# Patient Record
Sex: Female | Born: 1995 | Race: White | Hispanic: No | Marital: Single | Smoking: Never smoker
Health system: Southern US, Community
[De-identification: ages and names within clinical notes are randomized; demographics above are authoritative.]

## PROBLEM LIST (undated history)

## (undated) DIAGNOSIS — L509 Urticaria, unspecified: Secondary | ICD-10-CM

## (undated) HISTORY — DX: Urticaria, unspecified: L50.9

---

## 2019-03-05 ENCOUNTER — Other Ambulatory Visit: Payer: Self-pay | Admitting: Obstetrics and Gynecology

## 2019-03-05 DIAGNOSIS — N6320 Unspecified lump in the left breast, unspecified quadrant: Secondary | ICD-10-CM

## 2019-03-14 ENCOUNTER — Other Ambulatory Visit: Payer: Self-pay

## 2019-03-14 ENCOUNTER — Ambulatory Visit
Admission: RE | Admit: 2019-03-14 | Discharge: 2019-03-14 | Disposition: A | Discharge status: Home / Self Care | Payer: Commercial Managed Care - PPO | Source: Ambulatory Visit | Attending: Obstetrics and Gynecology | Admitting: Obstetrics and Gynecology

## 2019-03-14 DIAGNOSIS — N6320 Unspecified lump in the left breast, unspecified quadrant: Secondary | ICD-10-CM

## 2020-01-06 ENCOUNTER — Other Ambulatory Visit: Payer: Self-pay

## 2020-01-06 ENCOUNTER — Encounter (HOSPITAL_COMMUNITY): Payer: Self-pay

## 2020-01-06 DIAGNOSIS — Z79899 Other long term (current) drug therapy: Secondary | ICD-10-CM | POA: Insufficient documentation

## 2020-01-06 DIAGNOSIS — R110 Nausea: Secondary | ICD-10-CM | POA: Insufficient documentation

## 2020-01-06 DIAGNOSIS — R1013 Epigastric pain: Secondary | ICD-10-CM | POA: Insufficient documentation

## 2020-01-06 LAB — COMPREHENSIVE METABOLIC PANEL
ALT: 24 U/L (ref 0–44)
AST: 20 U/L (ref 15–41)
Albumin: 4.2 g/dL (ref 3.5–5.0)
Alkaline Phosphatase: 103 U/L (ref 38–126)
Anion gap: 9 (ref 5–15)
BUN: 10 mg/dL (ref 6–20)
CO2: 26 mmol/L (ref 22–32)
Calcium: 9.1 mg/dL (ref 8.9–10.3)
Chloride: 103 mmol/L (ref 98–111)
Creatinine, Ser: 0.62 mg/dL (ref 0.44–1.00)
GFR calc Af Amer: >60 mL/min (ref >60)
GFR calc non Af Amer: >60 mL/min (ref >60)
Glucose, Bld: 122 mg/dL — ABNORMAL HIGH (ref 70–99)
Potassium: 4.3 mmol/L (ref 3.5–5.1)
Sodium: 138 mmol/L (ref 135–145)
Total Bilirubin: 0.3 mg/dL (ref 0.3–1.2)
Total Protein: 7.4 g/dL (ref 6.5–8.1)

## 2020-01-06 LAB — CBC
HCT: 43.2 % (ref 36.0–46.0)
Hemoglobin: 13.9 g/dL (ref 12.0–15.0)
MCH: 27.3 pg (ref 26.0–34.0)
MCHC: 32.2 g/dL (ref 30.0–36.0)
MCV: 84.9 fL (ref 80.0–100.0)
Platelets: 319 10*3/uL (ref 150–400)
RBC: 5.09 MIL/uL (ref 3.87–5.11)
RDW: 13.2 % (ref 11.5–15.5)
WBC: 9.7 10*3/uL (ref 4.0–10.5)
nRBC: 0 % (ref 0.0–0.2)

## 2020-01-06 LAB — I-STAT BETA HCG BLOOD, ED (MC, WL, AP ONLY): I-stat hCG, quantitative: <5 m[IU]/mL (ref <5)

## 2020-01-06 LAB — LIPASE, BLOOD: Lipase: 27 U/L (ref 11–51)

## 2020-01-06 LAB — URINALYSIS, ROUTINE W REFLEX MICROSCOPIC
Bilirubin Urine: NEGATIVE
Glucose, UA: NEGATIVE mg/dL
Hgb urine dipstick: NEGATIVE
Ketones, ur: NEGATIVE mg/dL
Leukocytes,Ua: NEGATIVE
Nitrite: NEGATIVE
Protein, ur: NEGATIVE mg/dL
Specific Gravity, Urine: 1.018 (ref 1.005–1.030)
pH: 8 (ref 5.0–8.0)

## 2020-01-06 NOTE — ED Triage Notes (Signed)
Upper mid abdominal pain x 1 week. Pain worse after eating.

## 2020-01-06 NOTE — ED Notes (Signed)
Pt provided w/labeled urine specimen cup for collection. ENMiles 

## 2020-01-07 ENCOUNTER — Emergency Department (HOSPITAL_COMMUNITY)
Admission: EM | Admit: 2020-01-07 | Discharge: 2020-01-07 | Disposition: A | Discharge status: Home / Self Care | Payer: Commercial Managed Care - PPO | Attending: Emergency Medicine | Admitting: Emergency Medicine

## 2020-01-07 DIAGNOSIS — R1013 Epigastric pain: Secondary | ICD-10-CM

## 2020-01-07 MED ORDER — OMEPRAZOLE 20 MG PO CPDR: DELAYED_RELEASE_CAPSULE | ORAL | 0 refills | Status: AC

## 2020-01-07 MED ORDER — ONDANSETRON HCL 4 MG PO TABS: 4.0000 mg | ORAL_TABLET | Freq: Three times a day (TID) | ORAL | 0 refills | Status: AC | PRN

## 2020-01-07 NOTE — ED Notes (Signed)
Pt denies any episodes of emesis. Pt states she's had 4 episdoes of diarrhea. She also states that she had one episode of blood in her stools but is unsure as to whether or not it was due to her menstrual cycle. Pt states she has some nausea that comes and goes. Pt denies chest pain, fever, SOB. Pt denies any one around her that has been sick with similar symptoms.

## 2020-01-07 NOTE — ED Notes (Signed)
Verbalized understanding discharge instructions, prescriptions, and follow-up.  Pt knows not to eat and call us first thing.   In no acute distress.      No d/c vitals.  Pt was anxious to leave.

## 2020-01-07 NOTE — Discharge Instructions (Addendum)
Please call [phone] after 8:30 this morning to schedule an outpatient ultrasound of your abdomen to look for gallstones.  For that test you need to have not eaten for at least 6 hours before hand.  I would wait to eat breakfast in the morning to see what time your appointment is.  Take the medication, omeprazole as prescribed.  If you do have gallstones you can call Dr. Maurine Minister office, she is a Development worker, international aid, to discuss treatment.  If you do not have gallstones and you continue to have abdominal discomfort, call [facility] to be evaluated by a stomach specialist.  Return to the emergency department if you get fever, uncontrolled vomiting, or you have constant abdominal pain that is lasting more than a couple hours.

## 2020-01-07 NOTE — ED Provider Notes (Signed)
[facility]-EMERGENCY DEPT Provider Note   CSN: [id number] Arrival date & time: 01/06/20  2027   Time seen 2:33 AM  History Chief Complaint  Patient presents with  . Abdominal Pain    Nichole Wagner is a 24 y.o. female.  HPI   Patient states May 15 she ate at a [employer] and ate grilled shrimp.  She states that day she had some queasiness or nausea.  The following day she started having epigastric abdominal pain.  She states the pain will come and go and last 30 to 60 seconds and would happen about every hour.  However she states yesterday she only had 2 episodes and today she only had one episode.  She describes the pain as sharp.  She denies having any reflux symptoms such as burning in her throat or in in her chest.  She states she noted that eating pizza made the pain a lot worse.  She has noted that she continues to have some pain after eating but she has tried to eat more of a bland diet.  She states the pain can get worse after eating either right away or sometimes an hour later.  She has been having nausea and vomiting off and on but has not had vomiting.  She did have some diarrhea on the 18th.  She is only had 4 days of diarrhea and states she had 2 episodes of diarrhea 1 day and then the other 3 days had one episode.  She denies any fever.  She denies any radiation of the pain into her back.  She states she is never had this before.  There is a family history of gallstones in the ladies.  PCP [name], Eber Jones, [name]   History reviewed. No pertinent past medical history.  There are no problems to display for this patient.   History reviewed. No pertinent surgical history.   OB History   No obstetric history on file.     No family history on file.  Social History   Tobacco Use  . Smoking status: Never Smoker  . Smokeless tobacco: Never Used  Substance Use Topics  . Alcohol use: Yes  . Drug use: Never  employed  Home Medications Prior  to Admission medications   Medication Sig Start Date End Date Taking? Authorizing Provider  levonorgestrel-ethinyl estradiol (KURVELO) 0.15-30 MG-MCG tablet Take 1 tablet by mouth daily.    Yes [provider]  levothyroxine (SYNTHROID) 75 MCG tablet Take 75 mcg by mouth daily before breakfast.    Yes [provider]  sertraline (ZOLOFT) 25 MG tablet Take 25 mg by mouth daily.   Yes [provider]  omeprazole (PRILOSEC) 20 MG capsule Take 1 po BID x 2 weeks then once a day 01/07/20   [name], MD  ondansetron (ZOFRAN) 4 MG tablet Take 1 tablet (4 mg total) by mouth every 8 (eight) hours as needed for nausea or vomiting. 01/07/20   [name], MD    Allergies    Aspirin, Ibuprofen, and Naproxen  Review of Systems   Review of Systems  All other systems reviewed and are negative.   Physical Exam Updated Vital Signs BP (!) 134/102 (BP Location: Right Arm)   Pulse 79   Temp 98.2 F (36.8 C) (Oral)   Resp 16   Ht 5\' 3"  (1.6 m)   Wt 99.8 kg   SpO2 99%   BMI 38.97 kg/m   Physical Exam Vitals and nursing note reviewed.  Constitutional:      Appearance: Normal appearance. She is obese.  HENT:     Head: Normocephalic and atraumatic.     Right Ear: External ear normal.     Left Ear: External ear normal.     Nose: Nose normal.     Mouth/Throat:     Pharynx: Oropharynx is clear. No oropharyngeal exudate or posterior oropharyngeal erythema.  Eyes:     Extraocular Movements: Extraocular movements intact.     Conjunctiva/sclera: Conjunctivae normal.     Pupils: Pupils are equal, round, and reactive to light.  Cardiovascular:     Rate and Rhythm: Normal rate and regular rhythm.  Pulmonary:     Effort: Pulmonary effort is normal. No respiratory distress.  Abdominal:     General: Abdomen is flat. Bowel sounds are normal.     Palpations: Abdomen is soft.     Tenderness: There is abdominal tenderness.     Comments: Mild tenderness in the epigastric area    Musculoskeletal:        General: Normal range of motion.     Cervical back: Normal range of motion.  Skin:    General: Skin is warm and dry.  Neurological:     General: No focal deficit present.     Mental Status: She is alert and oriented to person, place, and time.     Cranial Nerves: No cranial nerve deficit.  Psychiatric:        Mood and Affect: Mood normal.        Behavior: Behavior normal.        Thought Content: Thought content normal.     ED Results / Procedures / Treatments   Labs (all labs ordered are listed, but only abnormal results are displayed) Results for orders placed or performed during the hospital encounter of 01/07/20  Lipase, blood  Result Value Ref Range   Lipase 27 11 - 51 U/L  Comprehensive metabolic panel  Result Value Ref Range   Sodium 138 135 - 145 mmol/L   Potassium 4.3 3.5 - 5.1 mmol/L   Chloride 103 98 - 111 mmol/L   CO2 26 22 - 32 mmol/L   Glucose, Bld 122 (H) 70 - 99 mg/dL   BUN 10 6 - 20 mg/dL   Creatinine, Ser 8.41 0.44 - 1.00 mg/dL   Calcium 9.1 8.9 - 32.4 mg/dL   Total Protein 7.4 6.5 - 8.1 g/dL   Albumin 4.2 3.5 - 5.0 g/dL   AST 20 15 - 41 U/L   ALT 24 0 - 44 U/L   Alkaline Phosphatase 103 38 - 126 U/L   Total Bilirubin 0.3 0.3 - 1.2 mg/dL   GFR calc non Af Amer >60 >60 mL/min   GFR calc Af Amer >60 >60 mL/min   Anion gap 9 5 - 15  CBC  Result Value Ref Range   WBC 9.7 4.0 - 10.5 K/uL   RBC 5.09 3.87 - 5.11 MIL/uL   Hemoglobin 13.9 12.0 - 15.0 g/dL   HCT 40.1 02.7 - 25.3 %   MCV 84.9 80.0 - 100.0 fL   MCH 27.3 26.0 - 34.0 pg   MCHC 32.2 30.0 - 36.0 g/dL   RDW 66.4 40.3 - 47.4 %   Platelets 319 150 - 400 K/uL   nRBC 0.0 0.0 - 0.2 %  Urinalysis, Routine w reflex microscopic  Result Value Ref Range   Color, Urine YELLOW YELLOW   APPearance CLOUDY (A) CLEAR   Specific Gravity, Urine 1.018  1.005 - 1.030   pH 8.0 5.0 - 8.0   Glucose, UA NEGATIVE NEGATIVE mg/dL   Hgb urine dipstick NEGATIVE NEGATIVE   Bilirubin Urine  NEGATIVE NEGATIVE   Ketones, ur NEGATIVE NEGATIVE mg/dL   Protein, ur NEGATIVE NEGATIVE mg/dL   Nitrite NEGATIVE NEGATIVE   Leukocytes,Ua NEGATIVE NEGATIVE  I-Stat beta hCG blood, ED  Result Value Ref Range   I-stat hCG, quantitative <5.0 <5 mIU/mL   Comment 3           Laboratory interpretation all normal except nonfasting hyperglycemia    EKG None  Radiology No results found.  Procedures Procedures (including critical care time)  Medications Ordered in ED Medications - No data to display  ED Course  I have reviewed the triage vital signs and the nursing notes.  Pertinent labs & imaging results that were available during my care of the patient were reviewed by me and considered in my medical decision making (see chart for details).    MDM Rules/Calculators/A&P                      Mother is concerned that patient may have gallbladder disease.  Her labs are normal, normal LFTs, lipase, white blood cell count.  Patient has only had one episode of discomfort today.  I discharged her home to take omeprazole.  She can call to get an outpatient right upper quadrant ultrasound done to look for gallstones.  If she has gallstones she should call the surgeon on call his office to be evaluated.  If she does not and she continues to have discomfort she can call the gastroenterologist on-call to be evaluated.  I suspect she may have some mild gastritis and hopefully the omeprazole will help with her symptoms.   Final Clinical Impression(s) / ED Diagnoses Final diagnoses:  Epigastric abdominal pain    Rx / DC Orders ED Discharge Orders         Ordered    US Abdomen Limited RUQ     01/07/20 0303    omeprazole (PRILOSEC) 20 MG capsule     01/07/20 0308    ondansetron (ZOFRAN) 4 MG tablet  Every 8 hours PRN     01/07/20 0308         Plan discharge  Rolland Porter, MD, Barbette Or, MD 01/07/20 [phone]

## 2020-01-30 ENCOUNTER — Ambulatory Visit: Payer: Self-pay | Admitting: Allergy & Immunology

## 2020-03-19 ENCOUNTER — Ambulatory Visit: Payer: Self-pay | Admitting: Allergy

## 2020-04-06 NOTE — Progress Notes (Signed)
New Patient Note  RE: Nichole Wagner Tursi MRN: [id number] DOB: 1996-05-24 Date of Office Visit: 04/07/2020  Referring provider: [name], [name], [name] Primary care provider: [name], [name], [name]  Chief Complaint: Urticaria (on and off)  History of Present Illness: I had the pleasure of seeing Nichole Wagner Caesar for initial evaluation at the [facility] on 04/08/2020. She is a 24 y.o. female, who is referred here by [name], [name], [name] for the evaluation of hives.  Rash:  Rash started about 2014. No triggers noted and she had allergy testing at that time and was positive to oak trees and dust mites per patient report. At that time she noted NSAIDs triggered her hives.  Over the years, she noted that she breaks out in hives with no NSAIDS in her system and no other triggers noted.  Mainly occurs on her face, forearms. Describes them as raised, rash, itchy. Individual rashes lasts about a few hours after benadryl. No ecchymosis upon resolution. Associated symptoms include: lip swelling and throat swelling at times. No respiratory compromise.  Usually breaks out 1-2 times per month in hives. The throat swelling happens more frequently and the lip swelling occurs less frequently.   Suspected triggers are NSAIDs and unknown. Denies any fevers, chills, changes in medications, foods, personal care products or recent infections. She has tried the following therapies: benadryl and Claritin with some benefit. Tried zyrtec 10mg  TID with no benefit. Currently on Claritin 10mg  daily.  Previous work up includes: just skin testing but no bloodwork.  Denies any reflux/heartburn symptoms.   Food: Patient has been having issues with abdominal issues - abdominal cramping, diarrhea, blood in the stool.  She saw her ENT for lymph node swelling and was told that it may be allergy related.   She had US for her abdomen which was normal. She was trialed on omeprazole with no benefit.  No  correlation with eating.   No previous GI evaluation.   Red meat and certain brands of water is triggering her symptoms. However she still has issues without consuming these 2 foods.  Diarrhea is present for most days at a time and then resolves.   Dietary History: patient has been eating other foods including milk, eggs, peanut, treenuts, sesame, shellfish, seafood, soy, wheat, meats, fruits and vegetables.  Assessment and Plan: [name] is a 24 y.o. female with: Urticaria Pruritic rash started in 2014 and that time it was mainly triggered by NSAIDs. However over the years still having outbreaks with no NSAIDs in her system. Outbreak 1-2 times per month and resolves within a few hours after Benadryl. At times noted associated throat and lip swelling. No respiratory compromise. Tried Zyrtec 10 mg in the past with no benefit. Currently on Claritin 10 mg daily. Denies any reflux or heartburn symptoms. Concerned about food and environmental allergy triggers.  Today's skin testing showed: Positive to grass, trees, dust mites. Positive to almonds, chicken and cabbage. Borderline to pistachio, barley and tomatoes.   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  Recommend trying Allegra 180mg  twice a day. Stop Claritin.  Start famotidine 20mg  twice a day.  Take pictures of rash/swelling. Get bloodwork to rule out other etiologies.  Angio-edema  See assessment and plan as above.  Abdominal bloating with cramps Patient has been having issues with abdominal cramping, diarrhea and blood in the stool. She did have a GI appointment but had to cancel due to COVID-19 infection. Tried omeprazole with  no better fit. No correlation with what she is eating.  Today's skin testing showed: Positive to almonds, chicken and cabbage. Borderline to pistachio, barley and tomatoes.   Discussed with patient that I'm not sure how much of the above foods are actually causing her  symptoms.   Avoid almonds, chicken, cabbage, pistachio, barley and tomatoes - she does consume almonds and chicken quite frequently.  Keep food journal.  Follow up with GI as well.   Other allergic rhinitis Mild rhinitis symptoms in the spring and summer. Takes Claritin and Flonase with good benefit.  Today's skin testing was positive to grass, tree and dust mites.  Start environmental control measures as below.  May use over the counter antihistamines such as Allegra as above.   History of asthma History of asthma as a child but now much improved. Triggers include humidity and URIs.  Today's spirometry was unremarkable.   May use albuterol rescue inhaler 2 puffs every 4 to 6 hours as needed for shortness of breath, chest tightness, coughing, and wheezing. May use albuterol rescue inhaler 2 puffs 5 to 15 minutes prior to strenuous physical activities. Monitor frequency of use.    Return in about 2 months (around 06/07/2020).  Meds ordered this encounter  Medications  . famotidine (PEPCID) 20 MG tablet    Sig: Take 1 tablet (20 mg total) by mouth 2 (two) times daily.    Dispense:  60 tablet    Refill:  5    Lab Orders     Alpha-Gal Panel     ANA w/Reflex     CBC with Differential/Platelet     C1 esterase inhibitor, functional     C1 Esterase Inhibitor     C3 and C4     Chronic Urticaria     C-reactive protein     Sedimentation rate     Thyroid Cascade Profile     Tryptase     Comprehensive metabolic panel  Other allergy screening: Asthma: yes  Patient had asthma as a child but which improved. Now sometimes have shortness of breath especially with humidity.  URIs trigger her symptoms.  Patient has albuterol at home to use if needed.  Rhino conjunctivitis: yes  Mild rhinitis symptoms in the spring and summer. Takes Claritin with good benefit.  Takes Flonase as needed.  Medication allergy: yes Hymenoptera allergy: no Eczema: yes as a child History of  recurrent infections suggestive of immunodeficency: no  Diagnostics: Spirometry:  Tracings reviewed. Her effort: It was hard to get consistent efforts and there is a question as to whether this reflects a maximal maneuver. FVC: 3.72L FEV1: 3.37L, 102% predicted FEV1/FVC ratio: 91% Interpretation: No overt abnormalities noted given today's efforts.  Please see scanned spirometry results for details.  Skin Testing: Environmental allergy panel and select foods. Positive to grass, trees, dust mites.  Positive to almonds, chicken and cabbage. Borderline to pistachio, barley and tomatoes.  Results discussed with patient/family.  Airborne Adult Perc - 04/07/20 0952    Time Antigen Placed 9892    Allergen Manufacturer Waynette Buttery    Location Back    Number of Test 59    Panel 1 Select    1. Control-Buffer 50% Glycerol Negative    2. Control-Histamine 1 mg/ml 2+    3. Albumin saline Negative    4. [place] --   +/-   5. [place] 2+    6. Johnson --   +\-   7. [facility] --   +\-  8. Meadow Fescue Negative    9. Perennial Rye Negative    10. Sweet Vernal Negative    11. Timothy Negative    12. Cocklebur Negative    13. Burweed Marshelder Negative    14. Ragweed, short Negative    15. Ragweed, Giant Negative    16. Plantain,  English Negative    17. Lamb's Quarters Negative    18. Sheep Sorrell Negative    19. Rough Pigweed Negative    20. Marsh Elder, Rough Negative    21. Mugwort, Common Negative    22. Ash mix Negative    23. Birch mix Negative    24. Beech American 2+    25. Box, Elder Negative    26. Cedar, red Negative    27. Cottonwood, [place] Negative    28. Elm mix Negative    29. Hickory 2+    30. Maple mix --   +/-   31. Oak, [place] mix Negative    32. Pecan Pollen --   +\-   33. Pine mix Negative    34. Sycamore Eastern Negative    35. Walnut, Black Pollen Negative    36. Alternaria alternata Negative    37. Cladosporium Herbarum Negative    38. Aspergillus mix  Negative    39. Penicillium mix Negative    40. Bipolaris sorokiniana (Helminthosporium) Negative    41. Drechslera spicifera (Curvularia) Negative    42. Mucor plumbeus Negative    43. Fusarium moniliforme Negative    44. Aureobasidium pullulans (pullulara) Negative    45. Rhizopus oryzae Negative    46. Botrytis cinera Negative    47. Epicoccum nigrum Negative    48. Phoma betae Negative    49. Candida Albicans Negative    50. Trichophyton mentagrophytes Negative    51. Mite, D Farinae  5,000 AU/ml Negative    52. Mite, D Pteronyssinus  5,000 AU/ml Negative    53. Cat Hair 10,000 BAU/ml Negative    54.  Dog Epithelia Negative    55. Mixed Feathers Negative    56. Horse Epithelia Negative    57. Cockroach, German Negative    58. Mouse Negative    59. Tobacco Leaf Negative          Intradermal - 04/07/20 1041    Time Antigen Placed 1030    Allergen Manufacturer Greer    Location Arm    Number of Test 11    Intradermal Select    Control Negative    Ragweed mix Negative    Weed mix Negative    Mold 1 Negative    Mold 2 Negative    Mold 3 Negative    Mold 4 Negative    Cat Negative    Dog Negative    Cockroach Negative    Mite mix 2+          Food Adult Perc - 04/07/20 0900    Time Antigen Placed 4098    Allergen Manufacturer Waynette Buttery    Location Back    Number of allergen test 72    1. Peanut Negative    2. Soybean Negative    3. Wheat Negative    4. Sesame Negative    5. Milk, cow Negative    6. Egg White, Chicken Negative    7. Casein Negative    8. Shellfish Mix Negative    9. Fish Mix Negative    10. Cashew Negative    11. Pecan Food Negative  12. Walnut Food Negative    13. Almond Negative   4x3   14. Hazelnut Negative    15. [place] nut Negative    16. Coconut Negative    17. Pistachio --   +\-   18. Catfish Negative    19. Bass Negative    20. Trout Negative    21. Tuna Negative    22. Salmon Negative    23. Flounder Negative    24. Codfish  Negative    25. Shrimp Negative    26. Crab Negative    27. Lobster Negative    28. Oyster Negative    29. Scallops Negative    30. Barley Negative    31. Oat  Negative   +\-   32. Rye  Negative    33. Hops Negative    34. Rice Negative    35. Cottonseed Negative    36. Saccharomyces Cerevisiae  Negative    37. Pork Negative    38. [place] Meat Negative    39. Chicken Meat --   5x5   40. Beef Negative    41. Lamb Negative    42. Tomato --   +\-   43. White Potato Negative    44. Sweet Potato Negative    45. Pea, Green/English Negative    46. Navy Bean Negative    47. Mushrooms Negative    48. Avocado Negative    49. Onion Negative    50. Cabbage --   5x3   51. Carrots Negative    52. Celery Negative    53. Corn Negative    54. Cucumber Negative    55. Grape (White seedless) Negative    56. Orange  Negative    57. Banana Negative    58. Apple Negative    59. Peach Negative    60. Strawberry Negative    61. Cantaloupe Negative    62. Watermelon Negative    63. Pineapple Negative    64. Chocolate/Cacao bean Negative    65. Karaya Gum Negative    66. Acacia (Arabic Gum) Negative    67. Cinnamon Negative    68. Nutmeg Negative    69. Ginger Negative    70. Garlic Negative    71. Pepper, black Negative    72. Mustard Negative           Past Medical History: Patient Active Problem List   Diagnosis Date Noted  . Urticaria 04/07/2020  . History of asthma 04/07/2020  . Angio-edema 04/07/2020  . Other allergic rhinitis 04/07/2020  . Abdominal bloating with cramps 04/07/2020   Past Medical History:  Diagnosis Date  . Urticaria    Past Surgical History: History reviewed. No pertinent surgical history. Medication List:  Current Outpatient Medications  Medication Sig Dispense Refill  . acetaminophen (TYLENOL) 500 MG tablet Take 1,000 mg by mouth daily as needed.    . diphenhydrAMINE (BENADRYL) 25 MG tablet Take 25 mg by mouth daily as needed.    Marland Kitchen  levonorgestrel-ethinyl estradiol (KURVELO) 0.15-30 MG-MCG tablet Take 1 tablet by mouth daily.     Marland Kitchen levothyroxine (SYNTHROID) 75 MCG tablet Take 75 mcg by mouth daily before breakfast.     . loratadine (CLARITIN) 10 MG tablet Take 10 mg by mouth daily as needed for allergies.    . Melatonin 5 MG CAPS Take 1 capsule by mouth daily as needed.    . sertraline (ZOLOFT) 25 MG tablet Take 25 mg by mouth daily.    Marland Kitchen  famotidine (PEPCID) 20 MG tablet Take 1 tablet (20 mg total) by mouth 2 (two) times daily. 60 tablet 5  . omeprazole (PRILOSEC) 20 MG capsule Take 1 po BID x 2 weeks then once a day 60 capsule 0  . ondansetron (ZOFRAN) 4 MG tablet Take 1 tablet (4 mg total) by mouth every 8 (eight) hours as needed for nausea or vomiting. 10 tablet 0   No current facility-administered medications for this visit.   Allergies: Allergies  Allergen Reactions  . Aspirin Hives  . Ibuprofen Hives  . Naproxen Hives   Social History: Social History   Socioeconomic History  . Marital status: Single    Spouse name: Not on file  . Number of children: Not on file  . Years of education: Not on file  . Highest education level: Not on file  Occupational History  . Not on file  Tobacco Use  . Smoking status: Never Smoker  . Smokeless tobacco: Never Used  Vaping Use  . Vaping Use: Never used  Substance and Sexual Activity  . Alcohol use: Not Currently  . Drug use: Never  . Sexual activity: Not on file  Other Topics Concern  . Not on file  Social History Narrative  . Not on file   Social Determinants of Health   Financial Resource Strain:   . Difficulty of Paying Living Expenses: Not on file  Food Insecurity:   . Worried About Programme researcher, broadcasting/film/video in the Last Year: Not on file  . Ran Out of Food in the Last Year: Not on file  Transportation Needs:   . Lack of Transportation (Medical): Not on file  . Lack of Transportation (Non-Medical): Not on file  Physical Activity:   . Days of Exercise per  Week: Not on file  . Minutes of Exercise per Session: Not on file  Stress:   . Feeling of Stress : Not on file  Social Connections:   . Frequency of Communication with Friends and Family: Not on file  . Frequency of Social Gatherings with Friends and Family: Not on file  . Attends Religious Services: Not on file  . Active Member of Clubs or Organizations: Not on file  . Attends Banker Meetings: Not on file  . Marital Status: Not on file   Lives in an apartment. Smoking: denies Occupation: Publishing rights manager History: Water Damage/mildew in the house: no Carpet in the family room: yes Carpet in the bedroom: yes Heating: electric Cooling: central Pet: yes 1 dog x 3 yrs, 1 cat x 4 yrs  Family History: History reviewed. No pertinent family history. Problem                               Relation Asthma                                   MGF Eczema                                No  Food allergy                          No  Allergic rhino conjunctivitis     Mother   Review of Systems  Constitutional: Negative for  appetite change, chills, fever and unexpected weight change.  HENT: Negative for congestion and rhinorrhea.   Eyes: Negative for itching.  Respiratory: Positive for shortness of breath. Negative for cough, chest tightness and wheezing.   Cardiovascular: Negative for chest pain.  Gastrointestinal: Positive for abdominal pain, blood in stool and diarrhea.  Genitourinary: Negative for difficulty urinating.  Skin: Positive for rash.  Allergic/Immunologic: Positive for environmental allergies.  Neurological: Negative for headaches.   Objective: BP 128/88   Pulse (!) 105   Temp 97.7 F (36.5 C) (Temporal)   Resp 16   Ht  (1.626 m)   Wt 237 lb 12.8 oz (107.9 kg)   SpO2 99%   BMI 40.82 kg/m  Body mass index is 40.82 kg/m. Physical Exam Vitals and nursing note reviewed.  Constitutional:      Appearance: Normal  appearance. She is well-developed. She is obese.  HENT:     Head: Normocephalic and atraumatic.     Right Ear: Tympanic membrane and external ear normal.     Left Ear: Tympanic membrane and external ear normal.     Nose: Nose normal.     Mouth/Throat:     Mouth: Mucous membranes are moist.     Pharynx: Oropharynx is clear.  Eyes:     Conjunctiva/sclera: Conjunctivae normal.  Cardiovascular:     Rate and Rhythm: Normal rate and regular rhythm.     Heart sounds: Normal heart sounds. No murmur heard.  No friction rub. No gallop.   Pulmonary:     Effort: Pulmonary effort is normal.     Breath sounds: Normal breath sounds. No wheezing, rhonchi or rales.  Musculoskeletal:     Cervical back: Neck supple.  Skin:    General: Skin is warm.     Findings: No rash.  Neurological:     Mental Status: She is alert and oriented to person, place, and time.  Psychiatric:        Behavior: Behavior normal.    The plan was reviewed with the patient/family, and all questions/concerned were addressed.  It was my pleasure to see Aasiyah today and participate in her care. Please feel free to contact me with any questions or concerns.  Sincerely,  Wyline Mood, DO Allergy & Immunology  [facility] office: [phone] [facility] office: [phone] [place] office: [phone]

## 2020-04-07 ENCOUNTER — Encounter: Payer: Self-pay | Admitting: Allergy

## 2020-04-07 ENCOUNTER — Other Ambulatory Visit: Payer: Self-pay

## 2020-04-07 ENCOUNTER — Ambulatory Visit (INDEPENDENT_AMBULATORY_CARE_PROVIDER_SITE_OTHER): Payer: Commercial Managed Care - PPO | Admitting: Allergy

## 2020-04-07 VITALS — BP 128/88 | HR 105 | Temp 97.7°F | Resp 16 | Ht 64.0 in | Wt 237.8 lb

## 2020-04-07 DIAGNOSIS — J3089 Other allergic rhinitis: Secondary | ICD-10-CM

## 2020-04-07 DIAGNOSIS — R109 Unspecified abdominal pain: Secondary | ICD-10-CM

## 2020-04-07 DIAGNOSIS — Z8709 Personal history of other diseases of the respiratory system: Secondary | ICD-10-CM | POA: Diagnosis not present

## 2020-04-07 DIAGNOSIS — J302 Other seasonal allergic rhinitis: Secondary | ICD-10-CM | POA: Insufficient documentation

## 2020-04-07 DIAGNOSIS — T783XXA Angioneurotic edema, initial encounter: Secondary | ICD-10-CM | POA: Insufficient documentation

## 2020-04-07 DIAGNOSIS — L509 Urticaria, unspecified: Secondary | ICD-10-CM | POA: Diagnosis not present

## 2020-04-07 DIAGNOSIS — T783XXD Angioneurotic edema, subsequent encounter: Secondary | ICD-10-CM

## 2020-04-07 DIAGNOSIS — R140 Abdominal distension (gaseous): Secondary | ICD-10-CM | POA: Insufficient documentation

## 2020-04-07 MED ORDER — FAMOTIDINE 20 MG PO TABS: 20.0000 mg | ORAL_TABLET | Freq: Two times a day (BID) | ORAL | 5 refills | Status: DC

## 2020-04-07 NOTE — Patient Instructions (Addendum)
Today's skin testing showed: Positive to grass, trees, dust mites.  Positive to almonds, chicken and cabbage. Borderline to pistachio, barley and tomatoes.   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  Recommend trying Allegra 180mg  twice a day. Stop Claritin.  Start famotidine 20mg  twice a day.  Take pictures of rash/swelling. Get bloodwork:  We are ordering labs, so please allow 1-2 weeks for the results to come back. With the newly implemented Cures Act, the labs might be visible to you at the same time that they become visible to me. However, I will not address the results until all of the results are back, so please be patient.  In the meantime, continue recommendations in your patient instructions, including avoidance measures (if applicable), until you hear from me.  Environmental allergies  Start environmental control measures as below.  May use over the counter antihistamines such as Allegra as above.   Abdominal pains:  Avoid almonds, chicken, cabbage, pistachio, barley and tomatoes.  Keep food journal.  Follow up with GI as well.   Breathing:  May use albuterol rescue inhaler 2 puffs every 4 to 6 hours as needed for shortness of breath, chest tightness, coughing, and wheezing. May use albuterol rescue inhaler 2 puffs 5 to 15 minutes prior to strenuous physical activities. Monitor frequency of use.   Follow up in 2 months or sooner if needed.    Skin care recommendations  Bath time: . Always use lukewarm water. AVOID very hot or cold water. Keep bathing time to 5-10 minutes. . Do NOT use bubble bath. . Use a mild soap and use just enough to wash the dirty areas. . Do NOT scrub skin vigorously.  . After bathing, pat dry your skin with a towel. Do NOT rub or scrub the skin.  Moisturizers and prescriptions:  . ALWAYS apply moisturizers immediately after bathing (within 3 minutes). This helps to lock-in moisture. . Use the moisturizer several  times a day over the whole body. summer moisturizers include: Aveeno, CeraVe, Cetaphil. Marland Kitchen winter moisturizers include: Aquaphor, Vaseline, Cerave, Cetaphil, Eucerin, Vanicream. . When using moisturizers along with medications, the moisturizer should be applied about one hour after applying the medication to prevent diluting effect of the medication or moisturize around where you applied the medications. When not using medications, the moisturizer can be continued twice daily as maintenance.  Laundry and clothing: . Avoid laundry products with added color or perfumes. . Use unscented hypo-allergenic laundry products such as Tide free, Cheer free & gentle, and All free and clear.  . If the skin still seems dry or sensitive, you can try double-rinsing the clothes. . Avoid tight or scratchy clothing such as wool. . Do not use fabric softeners or dyer sheets.  Reducing Pollen Exposure . Pollen seasons: trees (spring), grass (summer) and ragweed/weeds (fall). 08[birth year]-1982 Keep windows closed in your home and car to lower pollen exposure.  12-22-1980 air conditioning in the bedroom and throughout the house if possible.  . Avoid going out in dry windy days - especially early morning. . Pollen counts are highest between 5 - 10 AM and on dry, hot and windy days.  . Save outside activities for late afternoon or after a heavy rain, when pollen levels are lower.  . Avoid mowing of grass if you have grass pollen allergy. Marland Kitchen Be aware that pollen can also be transported indoors on people and pets.  . Dry your clothes in an automatic dryer rather than  hanging them outside where they might collect pollen.  . Rinse hair and eyes before bedtime. Control of House Dust Mite Allergen . Dust mite allergens are a common trigger of allergy and asthma symptoms. While they can be found throughout the house, these microscopic creatures thrive in warm, humid environments such as bedding, upholstered furniture and  carpeting. . Because so much time is spent in the bedroom, it is essential to reduce mite levels there.  . Encase pillows, mattresses, and box springs in special allergen-proof fabric covers or airtight, zippered plastic covers.  . Bedding should be washed weekly in hot water (130 F) and dried in a hot dryer. Allergen-proof covers are available for comforters and pillows that can't be regularly washed.  Reyes Ivan the allergy-proof covers every few months. Minimize clutter in the bedroom. Keep pets out of the bedroom.  Marland Kitchen Keep humidity less than 50% by using a dehumidifier or air conditioning. You can buy a humidity measuring device called a hygrometer to monitor this.  . If possible, replace carpets with hardwood, linoleum, or washable area rugs. If that's not possible, vacuum frequently with a vacuum that has a HEPA filter. . Remove all upholstered furniture and non-washable window drapes from the bedroom. . Remove all non-washable stuffed toys from the bedroom.  Wash stuffed toys weekly.

## 2020-04-08 ENCOUNTER — Encounter: Payer: Self-pay | Admitting: Allergy

## 2020-04-08 NOTE — Assessment & Plan Note (Signed)
Mild rhinitis symptoms in the spring and summer. Takes Claritin and Flonase with good benefit.  Today's skin testing was positive to grass, tree and dust mites.  Start environmental control measures as below.  May use over the counter antihistamines such as Allegra as above.

## 2020-04-08 NOTE — Assessment & Plan Note (Addendum)
Patient has been having issues with abdominal cramping, diarrhea and blood in the stool. She did have a GI appointment but had to cancel due to COVID-19 infection. Tried omeprazole with no better fit. No correlation with what she is eating.  Today's skin testing showed: Positive to almonds, chicken and cabbage. Borderline to pistachio, barley and tomatoes.   Discussed with patient that I'm not sure how much of the above foods are actually causing her symptoms.   Avoid almonds, chicken, cabbage, pistachio, barley and tomatoes - she does consume almonds and chicken quite frequently.  Keep food journal.  Follow up with GI as well.

## 2020-04-08 NOTE — Assessment & Plan Note (Signed)
History of asthma as a child but now much improved. Triggers include humidity and URIs.  Today's spirometry was unremarkable.   May use albuterol rescue inhaler 2 puffs every 4 to 6 hours as needed for shortness of breath, chest tightness, coughing, and wheezing. May use albuterol rescue inhaler 2 puffs 5 to 15 minutes prior to strenuous physical activities. Monitor frequency of use.

## 2020-04-08 NOTE — Assessment & Plan Note (Signed)
.   See assessment and plan as above. 

## 2020-04-08 NOTE — Assessment & Plan Note (Signed)
Pruritic rash started in 2014 and that time it was mainly triggered by NSAIDs. However over the years still having outbreaks with no NSAIDs in her system. Outbreak 1-2 times per month and resolves within a few hours after Benadryl. At times noted associated throat and lip swelling. No respiratory compromise. Tried Zyrtec 10 mg in the past with no benefit. Currently on Claritin 10 mg daily. Denies any reflux or heartburn symptoms. Concerned about food and environmental allergy triggers.  Today's skin testing showed: Positive to grass, trees, dust mites. Positive to almonds, chicken and cabbage. Borderline to pistachio, barley and tomatoes.   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  Recommend trying Allegra 180mg  twice a day. Stop Claritin.  Start famotidine 20mg  twice a day.  Take pictures of rash/swelling. Get bloodwork to rule out other etiologies.

## 2020-06-09 ENCOUNTER — Ambulatory Visit: Payer: Commercial Managed Care - PPO | Admitting: Allergy

## 2020-06-09 NOTE — Progress Notes (Deleted)
Follow Up Note  RE: Nichole Wagner MRN: [id number] DOB: 31-Aug-1995 Date of Office Visit: 06/09/2020  Referring provider: Marisa Cyphers, [name] Primary care provider: Marisa Cyphers, [name]  Chief Complaint: No chief complaint on file.  History of Present Illness: I had the pleasure of seeing Nichole Wagner for a follow up visit at the [facility] on 06/09/2020. She is a 24 y.o. female, who is being followed for urticaria/angioedema, abdominal pain, allergic rhinitis and history of asthma. Her previous allergy office visit was on 04/07/2020 with Dr. Selena Batten. Today is a regular follow up visit.  Urticaria Pruritic rash started in 2014 and that time it was mainly triggered by NSAIDs. However over the years still having outbreaks with no NSAIDs in her system. Outbreak 1-2 times per month and resolves within a few hours after Benadryl. At times noted associated throat and lip swelling. No respiratory compromise. Tried Zyrtec 10 mg in the past with no benefit. Currently on Claritin 10 mg daily. Denies any reflux or heartburn symptoms. Concerned about food and environmental allergy triggers.  Today's skin testing showed: Positive to grass, trees, dust mites. Positive to almonds, chicken and cabbage. Borderline to pistachio, barley and tomatoes.   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  Recommend trying Allegra 180mg  twice a day. Stop Claritin.  Start famotidine 20mg  twice a day.  Take pictures of rash/swelling.  Get bloodwork to rule out other etiologies.  Angio-edema  See assessment and plan as above.  Abdominal bloating with cramps Patient has been having issues with abdominal cramping, diarrhea and blood in the stool. She did have a GI appointment but had to cancel due to COVID-19 infection. Tried omeprazole with no better fit. No correlation with what she is eating.  Today's skin testing showed: Positive to almonds, chicken and  cabbage. Borderline to pistachio, barley and tomatoes.   Discussed with patient that I'm not sure how much of the above foods are actually causing her symptoms.   Avoid almonds, chicken, cabbage, pistachio, barley and tomatoes - she does consume almonds and chicken quite frequently.  Keep food journal.  Follow up with GI as well.   Other allergic rhinitis Mild rhinitis symptoms in the spring and summer. Takes Claritin and Flonase with good benefit.  Today's skin testing was positive to grass, tree and dust mites.  Start environmental control measures as below.  May use over the counter antihistamines such as Allegra as above.   History of asthma History of asthma as a child but now much improved. Triggers include humidity and URIs.  Today's spirometry was unremarkable.   May use albuterol rescue inhaler 2 puffs every 4 to 6 hours as needed for shortness of breath, chest tightness, coughing, and wheezing. May use albuterol rescue inhaler 2 puffs 5 to 15 minutes prior to strenuous physical activities. Monitor frequency of use.    Return in about 2 months (around 06/07/2020).  Assessment and Plan: Nichole Wagner is a 24 y.o. female with: No problem-specific Assessment & Plan notes found for this encounter.  No follow-ups on file.  No orders of the defined types were placed in this encounter.  Lab Orders  No laboratory test(s) ordered today    Diagnostics: Spirometry:  Tracings reviewed. Her effort: {Blank single:19197::"Good reproducible efforts.","It was hard to get consistent efforts and there is a question as to whether this reflects a maximal maneuver.","Poor effort, data can not be interpreted."} FVC: ***L FEV1: ***L, ***% predicted FEV1/FVC  ratio: ***% Interpretation: {Blank single:19197::"Spirometry consistent with mild obstructive disease","Spirometry consistent with moderate obstructive disease","Spirometry consistent with severe obstructive disease","Spirometry  consistent with possible restrictive disease","Spirometry consistent with mixed obstructive and restrictive disease","Spirometry uninterpretable due to technique","Spirometry consistent with normal pattern","No overt abnormalities noted given today's efforts"}.  Please see scanned spirometry results for details.  Skin Testing: {Blank single:19197::"Select foods","Environmental allergy panel","Environmental allergy panel and select foods","Food allergy panel","None","Deferred due to recent antihistamines use"}. Positive test to: ***. Negative test to: ***.  Results discussed with patient/family.   Medication List:  Current Outpatient Medications  Medication Sig Dispense Refill   acetaminophen (TYLENOL) 500 MG tablet Take 1,000 mg by mouth daily as needed.     diphenhydrAMINE (BENADRYL) 25 MG tablet Take 25 mg by mouth daily as needed.     famotidine (PEPCID) 20 MG tablet Take 1 tablet (20 mg total) by mouth 2 (two) times daily. 60 tablet 5   levonorgestrel-ethinyl estradiol (KURVELO) 0.15-30 MG-MCG tablet Take 1 tablet by mouth daily.      levothyroxine (SYNTHROID) 75 MCG tablet Take 75 mcg by mouth daily before breakfast.      loratadine (CLARITIN) 10 MG tablet Take 10 mg by mouth daily as needed for allergies.     Melatonin 5 MG CAPS Take 1 capsule by mouth daily as needed.     omeprazole (PRILOSEC) 20 MG capsule Take 1 po BID x 2 weeks then once a day 60 capsule 0   ondansetron (ZOFRAN) 4 MG tablet Take 1 tablet (4 mg total) by mouth every 8 (eight) hours as needed for nausea or vomiting. 10 tablet 0   sertraline (ZOLOFT) 25 MG tablet Take 25 mg by mouth daily.     No current facility-administered medications for this visit.   Allergies: Allergies  Allergen Reactions   Aspirin Hives   Ibuprofen Hives   Naproxen Hives   I reviewed her past medical history, social history, family history, and environmental history and no significant changes have been reported from her  previous visit.  Review of Systems  Constitutional: Negative for appetite change, chills, fever and unexpected weight change.  HENT: Negative for congestion and rhinorrhea.   Eyes: Negative for itching.  Respiratory: Positive for shortness of breath. Negative for cough, chest tightness and wheezing.   Cardiovascular: Negative for chest pain.  Gastrointestinal: Positive for abdominal pain, blood in stool and diarrhea.  Genitourinary: Negative for difficulty urinating.  Skin: Positive for rash.  Allergic/Immunologic: Positive for environmental allergies.  Neurological: Negative for headaches.   Objective: There were no vitals taken for this visit. There is no height or weight on file to calculate BMI. Physical Exam Vitals and nursing note reviewed.  Constitutional:      Appearance: Normal appearance. She is well-developed. She is obese.  HENT:     Head: Normocephalic and atraumatic.     Right Ear: Tympanic membrane and external ear normal.     Left Ear: Tympanic membrane and external ear normal.     Nose: Nose normal.     Mouth/Throat:     Mouth: Mucous membranes are moist.     Pharynx: Oropharynx is clear.  Eyes:     Conjunctiva/sclera: Conjunctivae normal.  Cardiovascular:     Rate and Rhythm: Normal rate and regular rhythm.     Heart sounds: Normal heart sounds. No murmur heard.  No friction rub. No gallop.   Pulmonary:     Effort: Pulmonary effort is normal.     Breath sounds: Normal breath sounds. No  wheezing, rhonchi or rales.  Musculoskeletal:     Cervical back: Neck supple.  Skin:    General: Skin is warm.     Findings: No rash.  Neurological:     Mental Status: She is alert and oriented to person, place, and time.  Psychiatric:        Behavior: Behavior normal.    Previous notes and tests were reviewed. The plan was reviewed with the patient/family, and all questions/concerned were addressed.  It was my pleasure to see Nichole Wagner today and participate in her  care. Please feel free to contact me with any questions or concerns.  Sincerely,  Wyline Mood, DO Allergy & Immunology  [facility] office: [phone] [facility] office: [phone]

## 2020-06-18 LAB — COMPREHENSIVE METABOLIC PANEL
ALT: 25 IU/L (ref 0–32)
AST: 14 IU/L (ref 0–40)
Albumin/Globulin Ratio: 2 (ref 1.2–2.2)
Albumin: 4.3 g/dL (ref 3.9–5.0)
Alkaline Phosphatase: 113 IU/L (ref 44–121)
BUN/Creatinine Ratio: 16 (ref 9–23)
BUN: 10 mg/dL (ref 6–20)
Bilirubin Total: 0.2 mg/dL (ref 0.0–1.2)
CO2: 20 mmol/L (ref 20–29)
Calcium: 9.4 mg/dL (ref 8.7–10.2)
Chloride: 104 mmol/L (ref 96–106)
Creatinine, Ser: 0.61 mg/dL (ref 0.57–1.00)
GFR calc Af Amer: 147 mL/min/{1.73_m2} (ref >59)
GFR calc non Af Amer: 127 mL/min/{1.73_m2} (ref >59)
Globulin, Total: 2.1 g/dL (ref 1.5–4.5)
Glucose: 117 mg/dL — ABNORMAL HIGH (ref 65–99)
Potassium: 3.8 mmol/L (ref 3.5–5.2)
Sodium: 140 mmol/L (ref 134–144)
Total Protein: 6.4 g/dL (ref 6.0–8.5)

## 2020-06-18 LAB — ALPHA-GAL PANEL
Alpha Gal IgE*: <0.1 kU/L (ref <0.10)
Beef (Bos spp) IgE: <0.1 kU/L (ref <0.35)
Class Interpretation: 0
Class Interpretation: 0
Class Interpretation: 0
Lamb/Mutton (Ovis spp) IgE: <0.1 kU/L (ref <0.35)
Pork (Sus spp) IgE: <0.1 kU/L (ref <0.35)

## 2020-06-18 LAB — CBC WITH DIFFERENTIAL/PLATELET
Basophils Absolute: 0.1 10*3/uL (ref 0.0–0.2)
Basos: 1 %
EOS (ABSOLUTE): 0.2 10*3/uL (ref 0.0–0.4)
Eos: 3 %
Hematocrit: 41.6 % (ref 34.0–46.6)
Hemoglobin: 13.4 g/dL (ref 11.1–15.9)
Immature Grans (Abs): 0 10*3/uL (ref 0.0–0.1)
Immature Granulocytes: 0 %
Lymphocytes Absolute: 2.5 10*3/uL (ref 0.7–3.1)
Lymphs: 38 %
MCH: 26.9 pg (ref 26.6–33.0)
MCHC: 32.2 g/dL (ref 31.5–35.7)
MCV: 83 fL (ref 79–97)
Monocytes Absolute: 0.4 10*3/uL (ref 0.1–0.9)
Monocytes: 6 %
Neutrophils Absolute: 3.4 10*3/uL (ref 1.4–7.0)
Neutrophils: 52 %
Platelets: 289 10*3/uL (ref 150–450)
RBC: 4.99 x10E6/uL (ref 3.77–5.28)
RDW: 13.5 % (ref 11.7–15.4)
WBC: 6.5 10*3/uL (ref 3.4–10.8)

## 2020-06-18 LAB — C-REACTIVE PROTEIN: CRP: 9 mg/L (ref 0–10)

## 2020-06-18 LAB — THYROID CASCADE PROFILE: TSH: 1.36 u[IU]/mL (ref 0.450–4.500)

## 2020-06-18 LAB — C3 AND C4
Complement C3, Serum: 155 mg/dL (ref 82–167)
Complement C4, Serum: 28 mg/dL (ref 12–38)

## 2020-06-18 LAB — C1 ESTERASE INHIBITOR, FUNCTIONAL: C1INH Functional/C1INH Total MFr SerPl: >91 %mean normal

## 2020-06-18 LAB — CHRONIC URTICARIA: cu index: 5.5 (ref <10)

## 2020-06-18 LAB — ANA W/REFLEX: Anti Nuclear Antibody (ANA): NEGATIVE

## 2020-06-18 LAB — TRYPTASE: Tryptase: 5.7 ug/L (ref 2.2–13.2)

## 2020-06-18 LAB — C1 ESTERASE INHIBITOR: C1INH SerPl-mCnc: 28 mg/dL (ref 21–39)

## 2020-06-18 LAB — SEDIMENTATION RATE: Sed Rate: 35 mm/hr — ABNORMAL HIGH (ref 0–32)

## 2020-12-19 ENCOUNTER — Other Ambulatory Visit: Payer: Self-pay | Admitting: Allergy

## 2021-02-01 IMAGING — US ULTRASOUND LEFT BREAST LIMITED
1 series · 6 of 6 positions shown · non-contrast
Comparison: Previous exam(s).

CLINICAL DATA: 23-year-old patient with a recently palpated lumps
in the superior breasts bilaterally, in the 11-12 o'clock positions.
The patient states that the lumps now feel smaller.

EXAM:
ULTRASOUND OF THE BILATERAL BREAST

[Series 1: ultrasound left breast limited · 0.08mm/px · 6 of 6 slices shown]
[im 1/6]
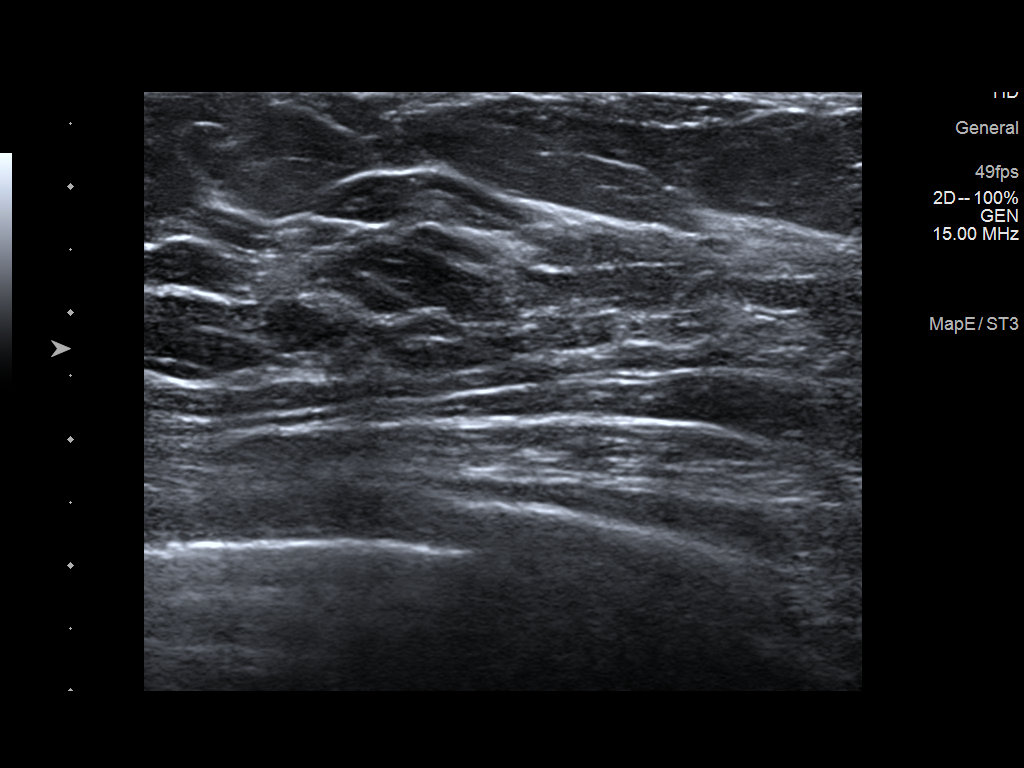
[im 2/6]
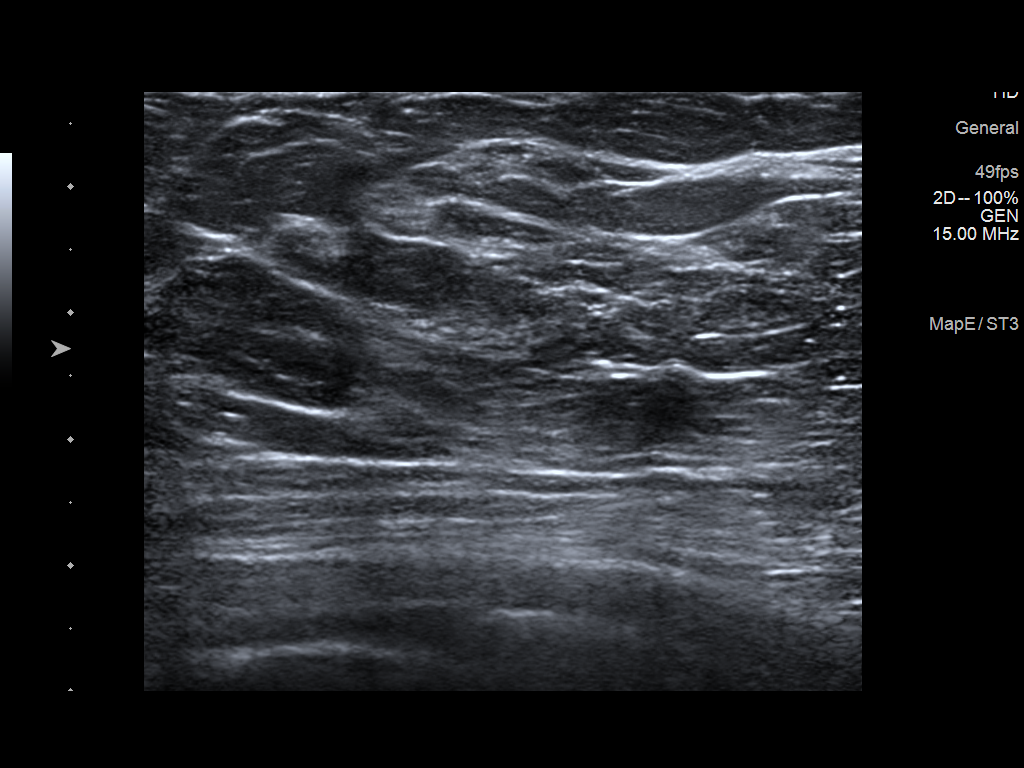
[im 3/6]
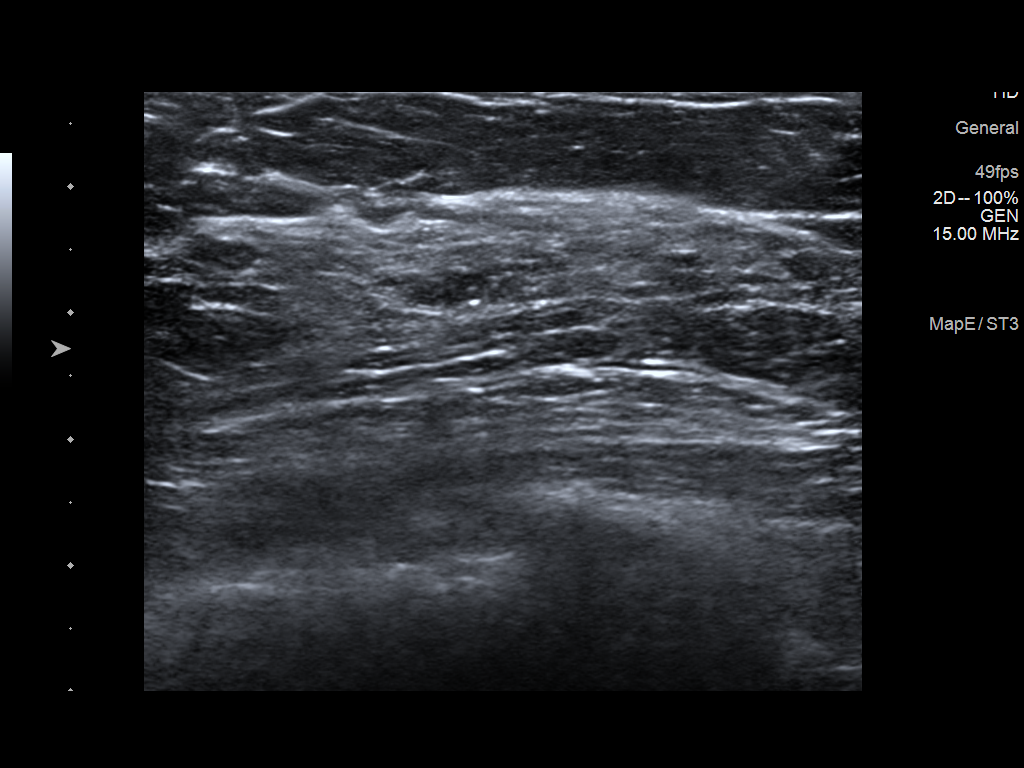
[im 4/6]
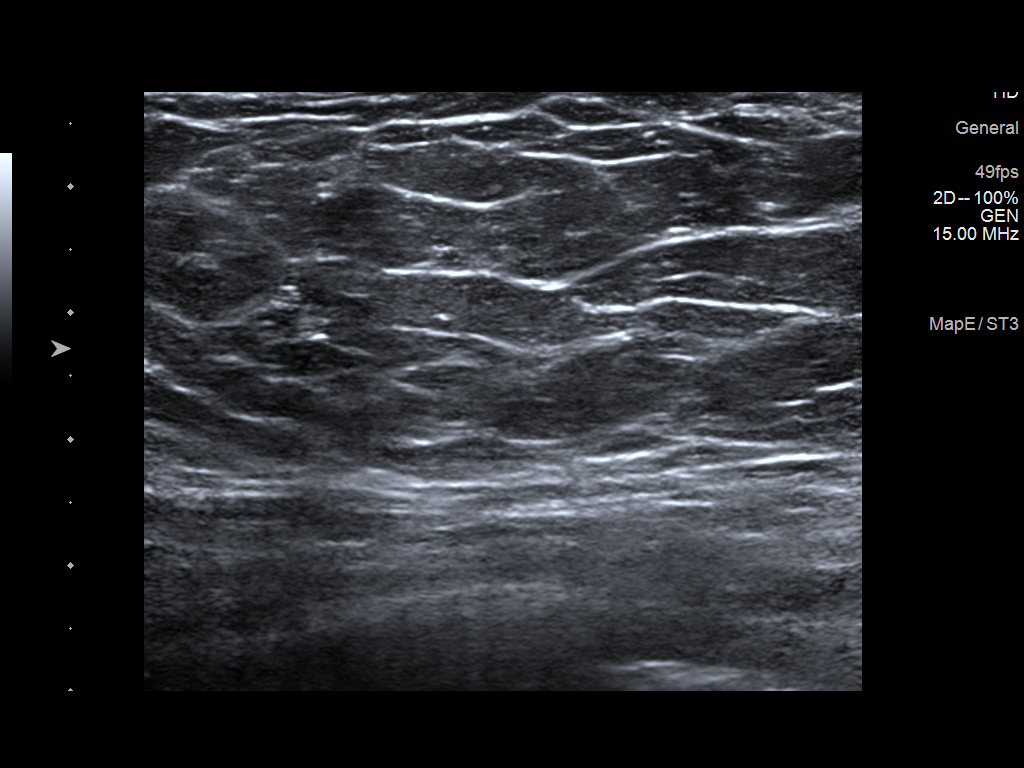
[im 5/6]
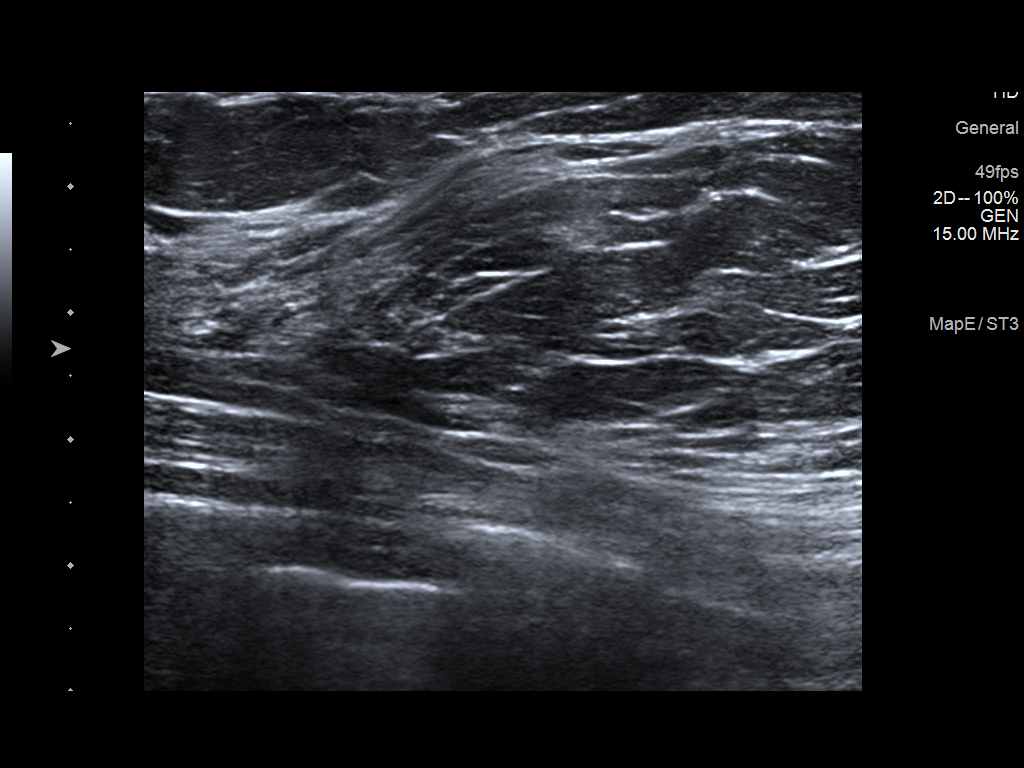
[im 6/6]
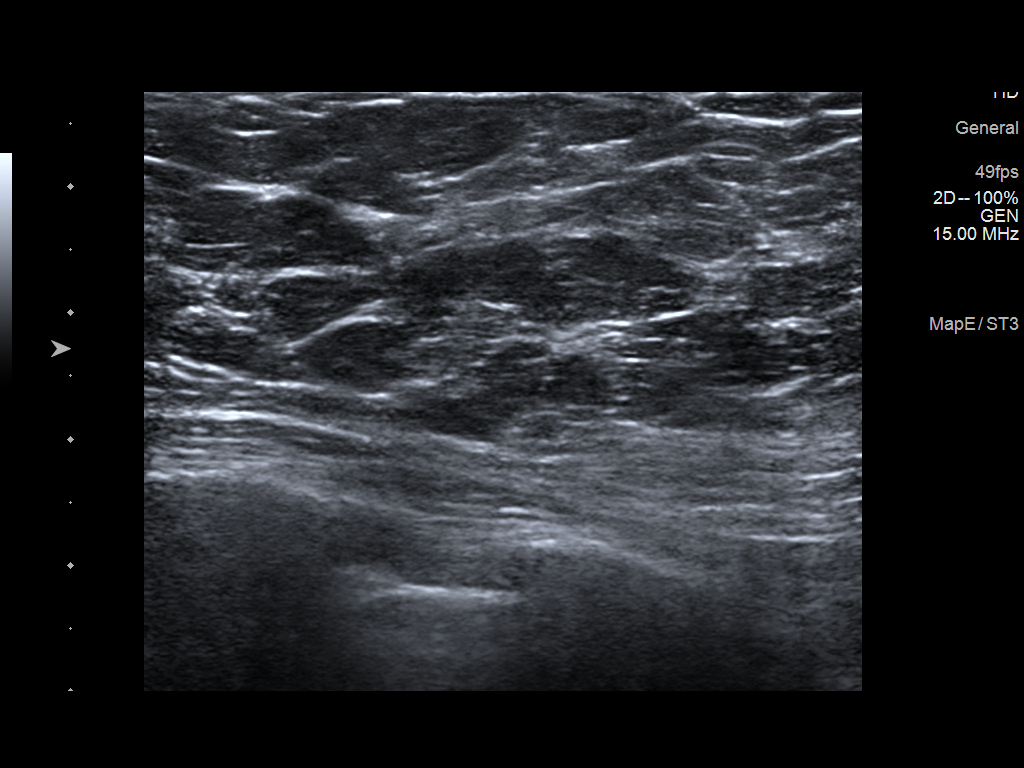

[6 of 6 positions shown; findings below may reference images not displayed]

FINDINGS: Targeted ultrasound is performed, showing normal predominately fatty
breast parenchyma in the 11 o'clock to 12 o'clock position of the
left breast in the region of recent palpable concern. No solid or
cystic mass or abnormal shadowing is identified.

Ultrasound of the superior right breast shows normal and
predominately fatty breast parenchyma in the [DATE] position of the
right breast 3 cm from the nipple in the region of patient concern.
The surrounding tissue is also negative. No solid or cystic mass or
abnormal shadowing is identified.
IMPRESSION: Normal breast parenchyma bilaterally in the regions of recent
concern.

RECOMMENDATION:
Screening mammogram at age 40 unless there are persistent or
intervening clinical concerns. (Code:K2-S-3OG)

I have discussed the findings and recommendations with the patient.
Results were also provided in writing at the conclusion of the
visit. If applicable, a reminder letter will be sent to the patient
regarding the next appointment.

BI-RADS CATEGORY  1: Negative.

## 2021-02-01 IMAGING — US ULTRASOUND RIGHT BREAST LIMITED
1 series · 2 of 2 positions shown · non-contrast
Comparison: Previous exam(s).

CLINICAL DATA: 23-year-old patient with a recently palpated lumps
in the superior breasts bilaterally, in the 11-12 o'clock positions.
The patient states that the lumps now feel smaller.

EXAM:
ULTRASOUND OF THE BILATERAL BREAST

[Series 1: ultrasound right breast limited · 0.08mm/px · 2 of 2 slices shown]
[im 1/2]
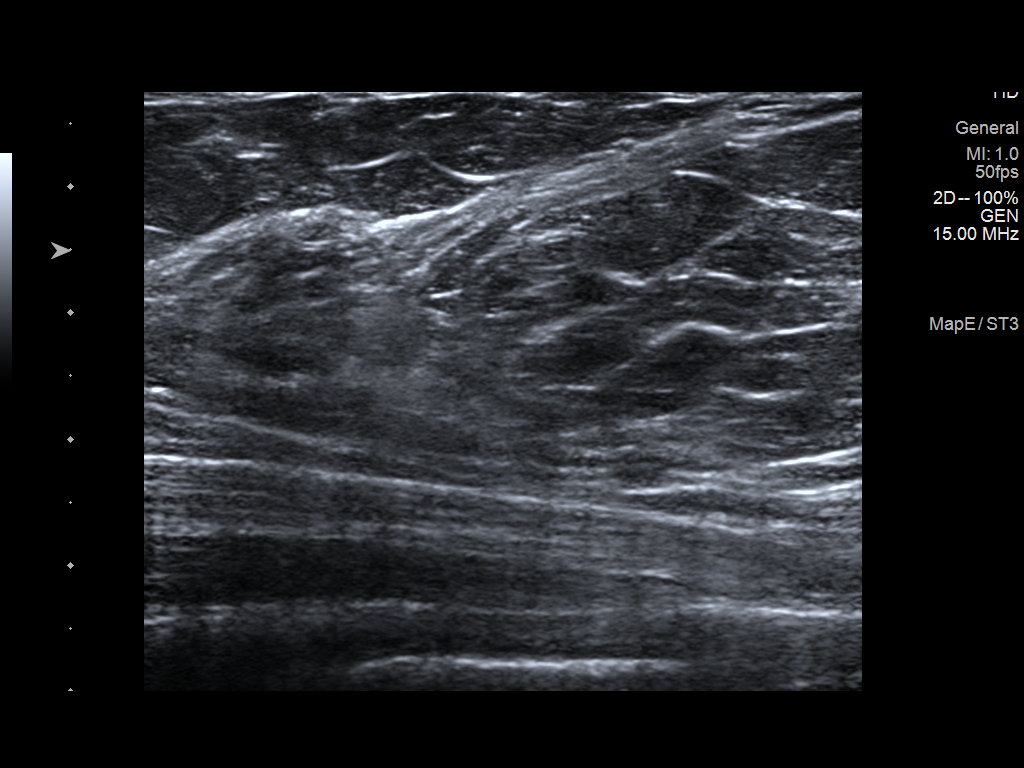
[im 2/2]
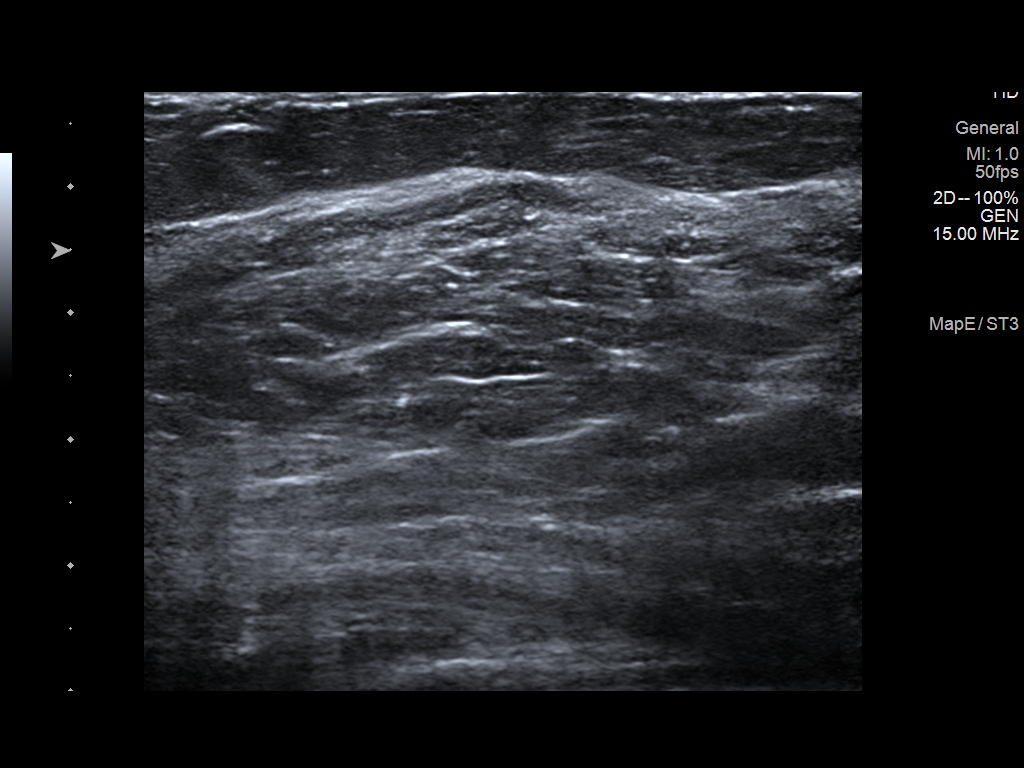

[2 of 2 positions shown; findings below may reference images not displayed]

FINDINGS: Targeted ultrasound is performed, showing normal predominately fatty
breast parenchyma in the 11 o'clock to 12 o'clock position of the
left breast in the region of recent palpable concern. No solid or
cystic mass or abnormal shadowing is identified.

Ultrasound of the superior right breast shows normal and
predominately fatty breast parenchyma in the [DATE] position of the
right breast 3 cm from the nipple in the region of patient concern.
The surrounding tissue is also negative. No solid or cystic mass or
abnormal shadowing is identified.
IMPRESSION: Normal breast parenchyma bilaterally in the regions of recent
concern.

RECOMMENDATION:
Screening mammogram at age 40 unless there are persistent or
intervening clinical concerns. (Code:K2-S-3OG)

I have discussed the findings and recommendations with the patient.
Results were also provided in writing at the conclusion of the
visit. If applicable, a reminder letter will be sent to the patient
regarding the next appointment.

BI-RADS CATEGORY  1: Negative.
